# Patient Record
Sex: Female | Born: 1980 | Race: Black or African American | Hispanic: No | Marital: Single | Smoking: Current some day smoker
Health system: Southern US, Community
[De-identification: ages and names within clinical notes are randomized; demographics above are authoritative.]

## PROBLEM LIST (undated history)

## (undated) DIAGNOSIS — R569 Unspecified convulsions: Secondary | ICD-10-CM

## (undated) DIAGNOSIS — N289 Disorder of kidney and ureter, unspecified: Secondary | ICD-10-CM

## (undated) DIAGNOSIS — I2510 Atherosclerotic heart disease of native coronary artery without angina pectoris: Secondary | ICD-10-CM

## (undated) DIAGNOSIS — E119 Type 2 diabetes mellitus without complications: Secondary | ICD-10-CM

## (undated) DIAGNOSIS — I10 Essential (primary) hypertension: Secondary | ICD-10-CM

## (undated) DIAGNOSIS — T829XXA Unspecified complication of cardiac and vascular prosthetic device, implant and graft, initial encounter: Secondary | ICD-10-CM

---

## 2016-02-19 ENCOUNTER — Emergency Department (HOSPITAL_COMMUNITY)
Admission: EM | Admit: 2016-02-19 | Discharge: 2016-02-19 | Disposition: A | Discharge status: Home / Self Care | Payer: Self-pay | Attending: Emergency Medicine | Admitting: Emergency Medicine

## 2016-02-19 ENCOUNTER — Emergency Department (HOSPITAL_COMMUNITY): Payer: Self-pay

## 2016-02-19 ENCOUNTER — Encounter (HOSPITAL_COMMUNITY): Payer: Self-pay | Admitting: Emergency Medicine

## 2016-02-19 DIAGNOSIS — I2510 Atherosclerotic heart disease of native coronary artery without angina pectoris: Secondary | ICD-10-CM | POA: Insufficient documentation

## 2016-02-19 DIAGNOSIS — I120 Hypertensive chronic kidney disease with stage 5 chronic kidney disease or end stage renal disease: Secondary | ICD-10-CM | POA: Insufficient documentation

## 2016-02-19 DIAGNOSIS — R0602 Shortness of breath: Secondary | ICD-10-CM | POA: Insufficient documentation

## 2016-02-19 DIAGNOSIS — F17200 Nicotine dependence, unspecified, uncomplicated: Secondary | ICD-10-CM | POA: Insufficient documentation

## 2016-02-19 DIAGNOSIS — R531 Weakness: Secondary | ICD-10-CM | POA: Insufficient documentation

## 2016-02-19 DIAGNOSIS — E119 Type 2 diabetes mellitus without complications: Secondary | ICD-10-CM | POA: Insufficient documentation

## 2016-02-19 DIAGNOSIS — N186 End stage renal disease: Secondary | ICD-10-CM | POA: Insufficient documentation

## 2016-02-19 HISTORY — DX: Essential (primary) hypertension: I10

## 2016-02-19 HISTORY — DX: Unspecified convulsions: R56.9

## 2016-02-19 HISTORY — DX: Unspecified complication of cardiac and vascular prosthetic device, implant and graft, initial encounter: T82.9XXA

## 2016-02-19 HISTORY — DX: Type 2 diabetes mellitus without complications: E11.9

## 2016-02-19 HISTORY — DX: Disorder of kidney and ureter, unspecified: N28.9

## 2016-02-19 HISTORY — DX: Atherosclerotic heart disease of native coronary artery without angina pectoris: I25.10

## 2016-02-19 LAB — COMPREHENSIVE METABOLIC PANEL
ALBUMIN: 3.2 g/dL — AB (ref 3.5–5.0)
ALK PHOS: 61 U/L (ref 38–126)
ALT: 8 U/L — AB (ref 14–54)
AST: 12 U/L — ABNORMAL LOW (ref 15–41)
Anion gap: 12 (ref 5–15)
BILIRUBIN TOTAL: 0.6 mg/dL (ref 0.3–1.2)
BUN: 64 mg/dL — AB (ref 6–20)
CALCIUM: 7.2 mg/dL — AB (ref 8.9–10.3)
CO2: 15 mmol/L — AB (ref 22–32)
CREATININE: 12.81 mg/dL — AB (ref 0.44–1.00)
Chloride: 111 mmol/L (ref 101–111)
GFR calc Af Amer: 4 mL/min — ABNORMAL LOW (ref >60)
GFR calc non Af Amer: 3 mL/min — ABNORMAL LOW (ref >60)
GLUCOSE: 86 mg/dL (ref 65–99)
Potassium: 4.6 mmol/L (ref 3.5–5.1)
SODIUM: 138 mmol/L (ref 135–145)
TOTAL PROTEIN: 6.8 g/dL (ref 6.5–8.1)

## 2016-02-19 LAB — CBC WITH DIFFERENTIAL/PLATELET
BASOS ABS: 0 10*3/uL (ref 0.0–0.1)
Basophils Relative: 0 %
EOS PCT: 3 %
Eosinophils Absolute: 0.3 10*3/uL (ref 0.0–0.7)
HEMATOCRIT: 27.4 % — AB (ref 36.0–46.0)
Hemoglobin: 9.1 g/dL — ABNORMAL LOW (ref 12.0–15.0)
LYMPHS PCT: 18 %
Lymphs Abs: 1.7 10*3/uL (ref 0.7–4.0)
MCH: 29.8 pg (ref 26.0–34.0)
MCHC: 33.2 g/dL (ref 30.0–36.0)
MCV: 89.8 fL (ref 78.0–100.0)
Monocytes Absolute: 0.4 10*3/uL (ref 0.1–1.0)
Monocytes Relative: 4 %
Neutro Abs: 7.2 10*3/uL (ref 1.7–7.7)
Neutrophils Relative %: 75 %
PLATELETS: 214 10*3/uL (ref 150–400)
RBC: 3.05 MIL/uL — AB (ref 3.87–5.11)
RDW: 13.8 % (ref 11.5–15.5)
WBC: 9.6 10*3/uL (ref 4.0–10.5)

## 2016-02-19 LAB — LIPASE, BLOOD: Lipase: 22 U/L (ref 11–51)

## 2016-02-19 LAB — BRAIN NATRIURETIC PEPTIDE: B Natriuretic Peptide: 939.2 pg/mL — ABNORMAL HIGH (ref 0.0–100.0)

## 2016-02-19 LAB — TROPONIN I: Troponin I: <0.03 ng/mL (ref <0.03)
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
## 2016-02-19 NOTE — ED Triage Notes (Addendum)
[place] moved to [place] 2 months ago, [facility] and had her last dialysis then, picked up at hotel [facility]  On [address] here in [place] , pt states she cannot go back to [place], c/o sob, states has not had dialysis   Since the on aug 23 in [place] and has not been able to get her meds in 3-4 weeks, pt has perm cath, requesting social worker

## 2016-02-19 NOTE — Discharge Planning (Signed)
Spoke with pt at bedside regarding HD OP clinic change and medication needs.  EDCM contacted Velna HatchetSheila in Dialysis CLIP to confirm that pt will have to go to [place] OP HD clinic to change centers.  The process is that pt current HD center will have to make referral/transfer care.  Regarding medication needs, pt states she has [place] [facility] and has [place] [facility] and was told it would take 90 days.  EDCM advised pt to visit [facility] and request financial counseling.  Pt verbalizes understanding.  No further CM needs identified at this time.

## 2016-02-19 NOTE — Progress Notes (Signed)
CSW and RN Case Manager attempted to speak with Patient at Patient's bedside. Patient presently at x-ray. CSW will attempt once Patient returns.          Lance MussAshley Gardner,MSW, LCSW [facility]/10M Clinical Social Worker [phone]
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
## 2016-02-19 NOTE — Discharge Instructions (Signed)
As discussed, it is very important that you discuss your dialysis needs with your dialysis center in [facility] to arrange for transfer of services to a [place] facility. You can do this over the telephone.  Please be sure to follow-up with a primary care physician, who can assist you with additional changes to your insurance, and provided help with obtaining the medication.  Return here for concerning changes in your condition.

## 2017-04-22 IMAGING — DX DG CHEST 2V
2 series · 2 of 2 positions shown · non-contrast
Comparison: 01/19/2016

CLINICAL DATA: Renal disorder.

EXAM:
CHEST  2 VIEW

[w chest pa]
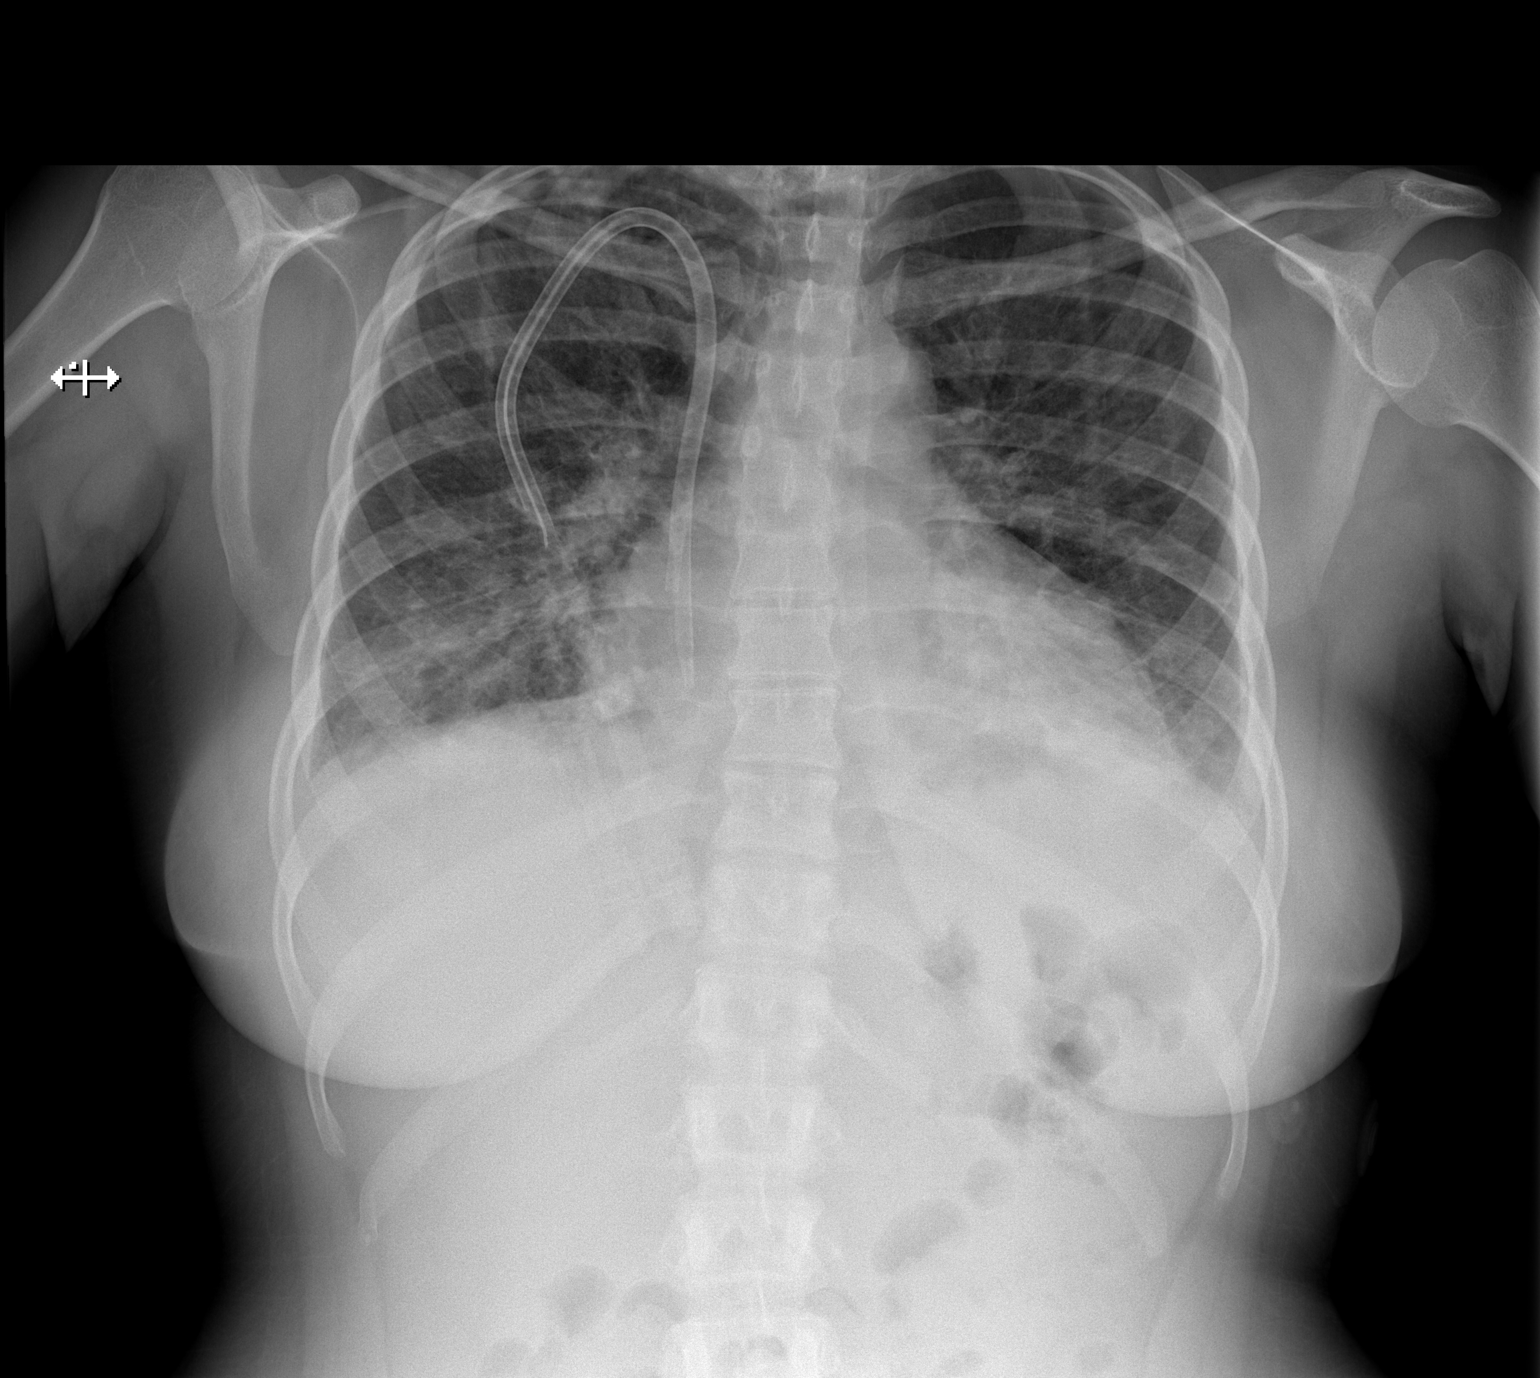

[w chest lat]
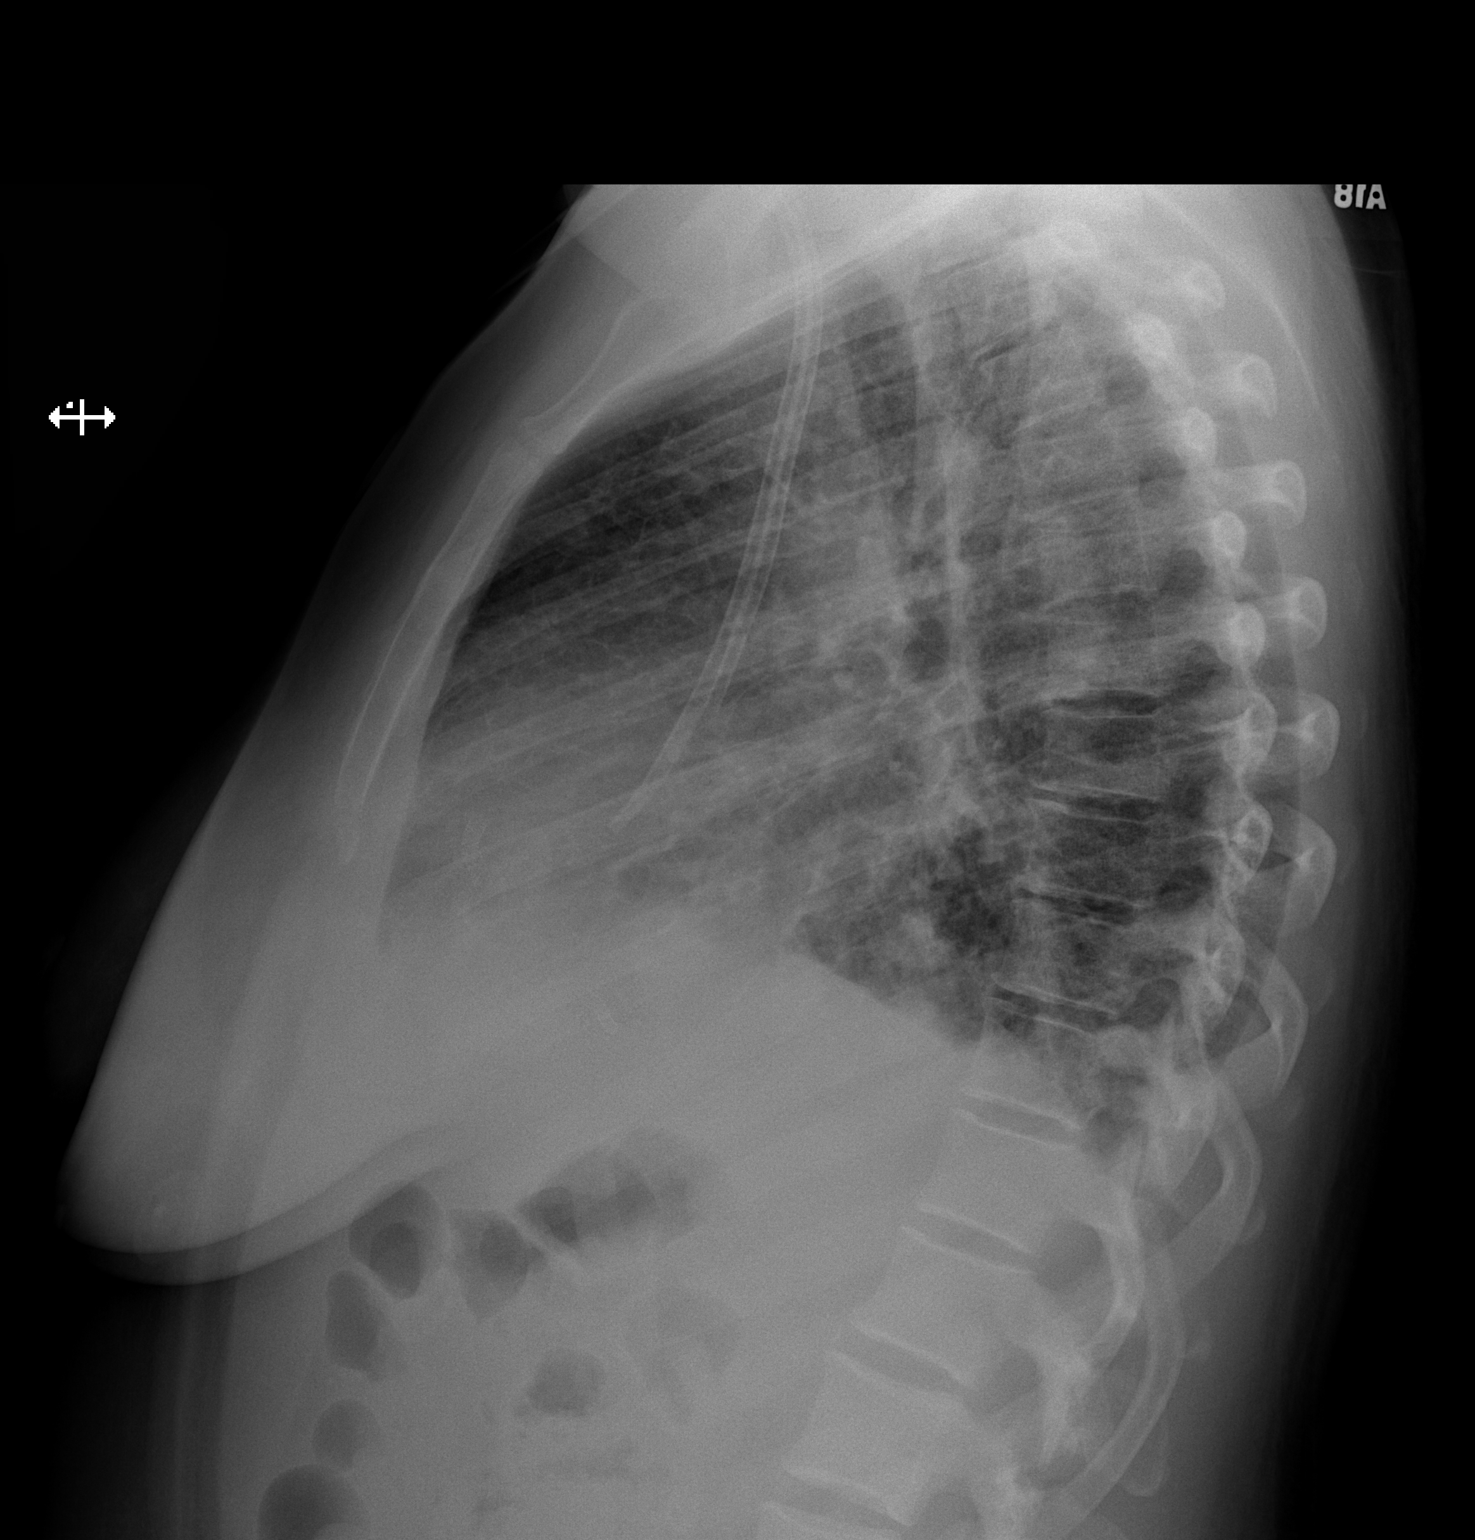

[2 of 2 positions shown; findings below may reference images not displayed]

FINDINGS: Low volume film. The cardio pericardial silhouette is enlarged.
There is pulmonary vascular congestion without overt pulmonary
edema. Bibasilar airspace disease may be related to dependent edema
although infection not excluded. Right IJ dialysis catheter tip up
overlies the mid right atrium. The visualized bony structures of the
thorax are intact.
IMPRESSION: Vascular congestion with basilar airspace disease likely
representing dependent edema although infection not excluded.
